# Patient Record
Sex: Male | Born: 1960 | Race: White | Hispanic: No | Marital: Married | Smoking: Former smoker
Health system: Southern US, Community
[De-identification: ages and names within clinical notes are randomized; demographics above are authoritative.]

## PROBLEM LIST (undated history)

## (undated) DIAGNOSIS — F40240 Claustrophobia: Secondary | ICD-10-CM

## (undated) DIAGNOSIS — R0981 Nasal congestion: Secondary | ICD-10-CM

## (undated) DIAGNOSIS — I10 Essential (primary) hypertension: Secondary | ICD-10-CM

## (undated) DIAGNOSIS — G629 Polyneuropathy, unspecified: Secondary | ICD-10-CM

## (undated) DIAGNOSIS — J189 Pneumonia, unspecified organism: Secondary | ICD-10-CM

## (undated) DIAGNOSIS — R519 Headache, unspecified: Secondary | ICD-10-CM

## (undated) DIAGNOSIS — K5900 Constipation, unspecified: Secondary | ICD-10-CM

## (undated) DIAGNOSIS — R51 Headache: Secondary | ICD-10-CM

## (undated) DIAGNOSIS — F419 Anxiety disorder, unspecified: Secondary | ICD-10-CM

## (undated) DIAGNOSIS — J449 Chronic obstructive pulmonary disease, unspecified: Secondary | ICD-10-CM

## (undated) DIAGNOSIS — E119 Type 2 diabetes mellitus without complications: Secondary | ICD-10-CM

## (undated) HISTORY — PX: COLONOSCOPY: SHX174

## (undated) HISTORY — PX: ROTATOR CUFF REPAIR: SHX139

---

## 2010-07-27 ENCOUNTER — Encounter: Payer: Self-pay | Admitting: Surgery

## 2010-08-24 ENCOUNTER — Encounter (INDEPENDENT_AMBULATORY_CARE_PROVIDER_SITE_OTHER): Payer: Federal, State, Local not specified - PPO | Admitting: Surgery

## 2010-08-24 ENCOUNTER — Encounter (INDEPENDENT_AMBULATORY_CARE_PROVIDER_SITE_OTHER): Payer: Federal, State, Local not specified - PPO

## 2010-08-24 DIAGNOSIS — I739 Peripheral vascular disease, unspecified: Secondary | ICD-10-CM

## 2010-08-24 DIAGNOSIS — M79609 Pain in unspecified limb: Secondary | ICD-10-CM

## 2010-08-25 NOTE — Assessment & Plan Note (Signed)
OFFICE VISIT  Bowns, Lajuan L DOB:  [birth year]                                       08/24/2010 CHART#:[id number]  REASON FOR VISIT:  Leg pain.  HISTORY:  The is a 50 year old gentleman I am seeing at the request of Dr. Mayford Knife for evaluation of bilateral leg pain left greater than right.  The patient states that specifically on the left side beginning at the knee and going down to his foot it feels numbness.  This has been going on for several years.  It occasionally causes some burning problems.  It can occur at anytime.  It is usually relieved with walking around.  He states it is also better when he does not wear a sock.  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  He does not endorse cramping or pain with walking.  He denies having any nonhealing wounds or ulcers on his feet.  The patient does suffer from hypertension and hypercholesterolemia, both of which are medically managed.  He is a 2 pack a day smoker.  REVIEW OF SYSTEMS:  Positive for reflux, constipation, pain in his legs. All other review of systems are negative.  PAST MEDICAL HISTORY:  Hypertension, hypercholesterolemia.  PAST SURGICAL HISTORY:  None.  SOCIAL HISTORY:  He is married, 3 children.  Smokes 2 packs a day.  Does not drink.  PHYSICAL EXAM:  Vital signs:  Heart rate 78, blood pressure 134/88, O2 sats 98%.  General:  He is well-appearing, in no distress.  HEENT: Within normal limits.  Lungs:  Respirations are nonlabored. Cardiovascular:  He has palpable dorsalis pedis pulses bilaterally. Skin:  Without rash.  DIAGNOSTIC STUDIES:  ABIs were performed today.  They are 1.1 bilaterally with triphasic waveforms.  ASSESSMENT:  Bilateral leg pain left greater than right.  PLAN:  I discussed today that based on the physical examination findings as well as the vascular lab studies his problems are not related to arterial insufficiency.  I  relayed to him that I believe his discomfort most likely is related to his job as he is on his knees all day every day and it has been that way for many years.  This has probably caused some nerve destruction or bone destruction that is contributing to his problems.  I would recommend seeing an orthopedist.  I am sure he has already done this in the past but a second opinion may be warranted.  In addition, we discussed the importance of smoking cessation.  We spent approximately 5 minutes talking about the ramifications of smoking in him.  I will not plan on seeing him back but will be happy to assist in any way I can.    Jorge Ny, MD Electronically Signed  VWB/MEDQ  D:  08/24/2010  T:  08/25/2010  Job:  [id number]  cc:   Dr Rance Muir

## 2015-11-04 ENCOUNTER — Encounter (HOSPITAL_COMMUNITY): Payer: Self-pay | Admitting: Emergency Medicine

## 2015-11-04 ENCOUNTER — Emergency Department (HOSPITAL_COMMUNITY)
Admission: EM | Admit: 2015-11-04 | Discharge: 2015-11-04 | Disposition: A | Discharge status: Home / Self Care | Payer: BLUE CROSS/BLUE SHIELD | Attending: Emergency Medicine | Admitting: Emergency Medicine

## 2015-11-04 DIAGNOSIS — I10 Essential (primary) hypertension: Secondary | ICD-10-CM | POA: Insufficient documentation

## 2015-11-04 DIAGNOSIS — F17200 Nicotine dependence, unspecified, uncomplicated: Secondary | ICD-10-CM | POA: Diagnosis not present

## 2015-11-04 DIAGNOSIS — R42 Dizziness and giddiness: Secondary | ICD-10-CM | POA: Diagnosis present

## 2015-11-04 HISTORY — DX: Polyneuropathy, unspecified: G62.9

## 2015-11-04 HISTORY — DX: Essential (primary) hypertension: I10

## 2015-11-04 LAB — CBC
HEMATOCRIT: 52.6 % — AB (ref 39.0–52.0)
HEMOGLOBIN: 18.4 g/dL — AB (ref 13.0–17.0)
MCH: 30.6 pg (ref 26.0–34.0)
MCHC: 35 g/dL (ref 30.0–36.0)
MCV: 87.5 fL (ref 78.0–100.0)
Platelets: 185 10*3/uL (ref 150–400)
RBC: 6.01 MIL/uL — ABNORMAL HIGH (ref 4.22–5.81)
RDW: 13.5 % (ref 11.5–15.5)
WBC: 10.2 10*3/uL (ref 4.0–10.5)

## 2015-11-04 LAB — BASIC METABOLIC PANEL
Anion gap: 7 (ref 5–15)
BUN: 14 mg/dL (ref 6–20)
CALCIUM: 9.1 mg/dL (ref 8.9–10.3)
CO2: 24 mmol/L (ref 22–32)
Chloride: 107 mmol/L (ref 101–111)
Creatinine, Ser: 0.98 mg/dL (ref 0.61–1.24)
GLUCOSE: 115 mg/dL — AB (ref 65–99)
Potassium: 4.1 mmol/L (ref 3.5–5.1)
Sodium: 138 mmol/L (ref 135–145)

## 2015-11-04 LAB — I-STAT TROPONIN, ED: Troponin i, poc: 0 ng/mL (ref 0.00–0.08)

## 2015-11-04 MED ORDER — MECLIZINE HCL 25 MG PO TABS
12.5000 mg | ORAL_TABLET | Freq: Once | ORAL | Status: AC
Start: 2015-11-04 — End: 2015-11-04
  Administered 2015-11-04: 12.5 mg via ORAL
  Filled 2015-11-04: qty 1

## 2015-11-04 MED ORDER — MECLIZINE HCL 12.5 MG PO TABS: 12.5000 mg | ORAL_TABLET | Freq: Three times a day (TID) | ORAL | Status: DC | PRN

## 2015-11-04 NOTE — Discharge Instructions (Signed)
Please read and follow all provided instructions.  Your diagnoses today include:  1. Essential hypertension    Tests performed today include:  Vital signs. See below for your results today.   Medications prescribed:   Take as prescribed   Home care instructions:  Follow any educational materials contained in this packet.  Follow-up instructions: Please follow-up with a primary care provider for further evaluation of symptoms and treatment. Call the number provided on discharge paperwork to establish one.    Return instructions:   Please return to the Emergency Department if you do not get better, if you get worse, or new symptoms OR  - Fever (temperature greater than 101.16F)  - Bleeding that does not stop with holding pressure to the area    -Severe pain (please note that you may be more sore the day after your accident)  - Chest Pain  - Difficulty breathing  - Severe nausea or vomiting  - Inability to tolerate food and liquids  - Passing out  - Skin becoming red around your wounds  - Change in mental status (confusion or lethargy)  - New numbness or weakness     Please return if you have any other emergent concerns.  Additional Information:  Your vital signs today were: BP 162/103 mmHg   Pulse 86   Temp(Src) 97.7 F (36.5 C) (Oral)   Resp 17   Ht 5\' 3"  (1.6 m)   Wt 91.173 kg   BMI 35.61 kg/m2   SpO2 98% If your blood pressure (BP) was elevated above 135/85 this visit, please have this repeated by your doctor within one month. ---------------

## 2015-11-04 NOTE — ED Notes (Signed)
Pt. Stated, I've been dizzy for about a year and i take medication and I've been going to [place] in [place].

## 2015-11-04 NOTE — ED Provider Notes (Signed)
CSN: [id number]     Arrival date & time 11/04/15  0840 History   First MD Initiated Contact with Patient 11/04/15 [phone]     Chief Complaint  Patient presents with  . Dizziness   (Consider location/radiation/quality/duration/timing/severity/associated sxs/prior Treatment) HPI 55 y.o. male with a hx of HTN, presents to the Emergency Department today complaining of dizziness x 1 year. Pt notes that there are no exacerbating factors with position or activities. States that this morning he woke up and did his normal morning activities. He went outside to smoke and became nauseas and felt his heart racing. States that he felt the room spinning. No pain at that time. Hx of the same over the past year. Seen by PCP in [place] and given Meclizine, which has helped, but patient ran out of medication. No CP/SOB/ABD pain. No emesis or diarrhea. No headaches. No vision changes. No fevers. Pt has borderline diabetes which he controls with diet. Has HTN which he takes a medication, but is unsure which one. No other symptoms noted.   Past Medical History  Diagnosis Date  . Hypertension   . Neuropathy (HCC)    History reviewed. No pertinent past surgical history. No family history on file. Social History  Substance Use Topics  . Smoking status: Current Every Day Smoker  . Smokeless tobacco: None  . Alcohol Use: No    Review of Systems ROS reviewed and all are negative for acute change except as noted in the HPI.  Allergies  Review of patient's allergies indicates not on file.  Home Medications   Prior to Admission medications   Not on File   BP 162/103 mmHg  Pulse 86  Temp(Src) 97.7 F (36.5 C) (Oral)  Resp 17  Ht 5\' 3"  (1.6 m)  Wt 91.173 kg  BMI 35.61 kg/m2  SpO2 98%   Physical Exam  Constitutional: He is oriented to person, place, and time. He appears well-developed and well-nourished.  HENT:  Head: Normocephalic and atraumatic.  Eyes: EOM are normal. Pupils are equal, round, and  reactive to light.  Neck: Normal range of motion. Neck supple. No tracheal deviation present.  Cardiovascular: Normal rate, regular rhythm, normal heart sounds and intact distal pulses.   No murmur heard. Pulmonary/Chest: Effort normal and breath sounds normal. No respiratory distress. He has no wheezes. He has no rales. He exhibits no tenderness.  Abdominal: Soft. There is no tenderness.  Musculoskeletal: Normal range of motion.  Neurological: He is alert and oriented to person, place, and time. He has normal strength. No cranial nerve deficit or sensory deficit.  Cranial Nerves:  II: Pupils equal, round, reactive to light III,IV, VI: ptosis not present, extra-ocular motions intact bilaterally  V,VII: smile symmetric, facial light touch sensation equal VIII: hearing grossly normal bilaterally  IX,X: midline uvula rise  XI: bilateral shoulder shrug equal and strong XII: midline tongue extension  Skin: Skin is warm and dry.  Psychiatric: He has a normal mood and affect. His behavior is normal. Thought content normal.  Nursing note and vitals reviewed.  ED Course  Procedures (including critical care time) Labs Review Labs Reviewed  CBC - Abnormal; Notable for the following:    RBC 6.01 (*)    Hemoglobin 18.4 (*)    HCT 52.6 (*)    All other components within normal limits  BASIC METABOLIC PANEL  I-STAT TROPOININ, ED   Imaging Review No results found. I have personally reviewed and evaluated these images and lab results as part of my  medical decision-making.   EKG Interpretation   Date/Time:  Tuesday November 04 2015 09:20:14 EDT Ventricular Rate:  79 PR Interval:    QRS Duration: 101 QT Interval:  375 QTC Calculation: 430 R Axis:   59 Text Interpretation:  Sinus rhythm Baseline wander in lead(s) V1 No old  tracing to compare Confirmed by FLOYD MD, DANIEL [id number]) on 11/04/2015  9:37:35 AM      MDM  I have reviewed and evaluated the relevant laboratory valuesI have  interpreted the relevant EKG.I have reviewed the relevant previous healthcare records.I obtained HPI from historian. Patient discussed with supervising physician  ED Course:  Assessment: Pt is a 55yM with hx HTN who presents with dizziness x 1 year. Onset this AM with nausea, dizziness and palpitations. Hx of the same x 1 year. No exacerbating factors. Happens multiple occasions throughout the day. Used to be on Meclizine, which helped, but pt does not have medication now. On exam, pt in NAD. Nontoxic/nonseptic appearing. VSS. Afebrile. Lungs CTA. Heart RRR. Abdomen nontender soft. CN Evaluated and unremarkable. iStat Trop negative. CBC showed elevated HGB/RBC/HCT likely due to chronic smoking. BMP unremarkable. EKG NSR. Possible HTN related due to elevated BP this AM, even with medication this AM. Question underlying anxiety? Pt planning on finding [place]CP here in [place]. Will refill Rx meclizine due to hx of improvement while on medication. No acute underlying pathology identified on labs/exam. Will not add or change BP medication as pt is unsure what he is on. Counseled on diet control and exercise. Will leave for PCP to change. Plan is to DC home with follow up to PCP. At time of discharge, Patient is in no acute distress. Vital Signs are stable. Patient is able to ambulate. Patient able to tolerate PO.    Disposition/Plan:  DC Home Additional Verbal discharge instructions given and discussed with patient.  Pt Instructed to f/u with PCP in the next week for evaluation and treatment of symptoms. Return precautions given Pt acknowledges and agrees with plan  Supervising Physician Melene Planan Floyd, DO   Final diagnoses:  Essential hypertension        Audry Piliyler Jennie Bolar, PA-C 11/04/15 1016  Melene Planan Floyd, DO 11/04/15 1021

## 2016-10-18 ENCOUNTER — Encounter (HOSPITAL_COMMUNITY): Payer: Self-pay

## 2016-10-18 ENCOUNTER — Emergency Department (HOSPITAL_COMMUNITY)
Admission: EM | Admit: 2016-10-18 | Discharge: 2016-10-19 | Disposition: A | Discharge status: Home / Self Care | Payer: BLUE CROSS/BLUE SHIELD | Attending: Emergency Medicine | Admitting: Emergency Medicine

## 2016-10-18 DIAGNOSIS — R2243 Localized swelling, mass and lump, lower limb, bilateral: Secondary | ICD-10-CM | POA: Diagnosis present

## 2016-10-18 DIAGNOSIS — I872 Venous insufficiency (chronic) (peripheral): Secondary | ICD-10-CM

## 2016-10-18 DIAGNOSIS — E785 Hyperlipidemia, unspecified: Secondary | ICD-10-CM | POA: Diagnosis not present

## 2016-10-18 DIAGNOSIS — E1140 Type 2 diabetes mellitus with diabetic neuropathy, unspecified: Secondary | ICD-10-CM | POA: Insufficient documentation

## 2016-10-18 DIAGNOSIS — Z87891 Personal history of nicotine dependence: Secondary | ICD-10-CM | POA: Diagnosis not present

## 2016-10-18 DIAGNOSIS — I10 Essential (primary) hypertension: Secondary | ICD-10-CM | POA: Insufficient documentation

## 2016-10-18 DIAGNOSIS — I739 Peripheral vascular disease, unspecified: Secondary | ICD-10-CM

## 2016-10-18 HISTORY — DX: Type 2 diabetes mellitus without complications: E11.9

## 2016-10-18 LAB — COMPREHENSIVE METABOLIC PANEL
ALBUMIN: 4 g/dL (ref 3.5–5.0)
ALK PHOS: 78 U/L (ref 38–126)
ALT: 24 U/L (ref 17–63)
ANION GAP: 10 (ref 5–15)
AST: 25 U/L (ref 15–41)
BILIRUBIN TOTAL: 0.7 mg/dL (ref 0.3–1.2)
BUN: 24 mg/dL — AB (ref 6–20)
CALCIUM: 8.9 mg/dL (ref 8.9–10.3)
CO2: 21 mmol/L — ABNORMAL LOW (ref 22–32)
Chloride: 106 mmol/L (ref 101–111)
Creatinine, Ser: 1.37 mg/dL — ABNORMAL HIGH (ref 0.61–1.24)
GFR calc Af Amer: >60 mL/min (ref >60)
GFR calc non Af Amer: 57 mL/min — ABNORMAL LOW (ref >60)
GLUCOSE: 116 mg/dL — AB (ref 65–99)
Potassium: 3.7 mmol/L (ref 3.5–5.1)
Sodium: 137 mmol/L (ref 135–145)
TOTAL PROTEIN: 6.7 g/dL (ref 6.5–8.1)

## 2016-10-18 LAB — CBC WITH DIFFERENTIAL/PLATELET
BASOS ABS: 0 10*3/uL (ref 0.0–0.1)
BASOS PCT: 0 %
EOS PCT: 2 %
Eosinophils Absolute: 0.1 10*3/uL (ref 0.0–0.7)
HCT: 46.4 % (ref 39.0–52.0)
Hemoglobin: 16.2 g/dL (ref 13.0–17.0)
Lymphocytes Relative: 32 %
Lymphs Abs: 2.8 10*3/uL (ref 0.7–4.0)
MCH: 30.2 pg (ref 26.0–34.0)
MCHC: 34.9 g/dL (ref 30.0–36.0)
MCV: 86.6 fL (ref 78.0–100.0)
MONO ABS: 0.6 10*3/uL (ref 0.1–1.0)
Monocytes Relative: 6 %
Neutro Abs: 5.2 10*3/uL (ref 1.7–7.7)
Neutrophils Relative %: 60 %
PLATELETS: 193 10*3/uL (ref 150–400)
RBC: 5.36 MIL/uL (ref 4.22–5.81)
RDW: 13.3 % (ref 11.5–15.5)
WBC: 8.8 10*3/uL (ref 4.0–10.5)

## 2016-10-18 NOTE — ED Provider Notes (Signed)
[facility]-EMERGENCY DEPT Provider Note   CSN: [id number] Arrival date & time: 10/18/16  2056  By signing my name below, I, Brett Fox, attest that this documentation has been prepared under the direction and in the presence of Brett Fox, Brett Alaimo, MD. Electronically Signed: Karren CobbleNy'Kea Fox, ED Scribe. 10/19/16. 12:01 AM.  History   Chief Complaint Chief Complaint  Patient presents with  . Leg Swelling   The history is provided by the patient and the spouse. No language interpreter was used.   HPI Comments: Brett Fox is a 56 y.o. male with a PMHx of HTN, HLD, neuropathy, and DM II who presents to the Emergency Department with  consistent bilateral lower extremity edema that began three weeks ago. Pt notes associated redness to right lower extremity, tingling to hi right heel, and a burning sensation. Three weeks ago, he notes BLE edema that worsens with standing and resolves while and resting at night. He reports he ambulatory without issues but notes associated discomfort. Pt states he had a sonogram fours days ago for possible DVT and he reports today they called him for a follow-up sonogram. He using ice packs with improvement of swelling. He reports a hx of tobacco use at two packs a day, since he was a teen but he states he quit one month ago. He denies experiences shortness of breath or chest pain with exertion, or hx of sleep apnea.  Denies chest pain, numbness, shortness of breath, weakness, back pain, or abdominal pain.   Past Medical History:  Diagnosis Date  . Diabetes mellitus without complication (HCC)   . Hypertension   . Neuropathy    There are no active problems to display for this patient.  History reviewed. No pertinent surgical history.  Home Medications    Prior to Admission medications   Medication Sig Start Date End Date Taking? Authorizing Provider  meclizine (ANTIVERT) 12.5 MG tablet Take 1 tablet (12.5 mg total) by mouth 3 (three) times daily as needed for  dizziness. 11/04/15   Audry PiliMohr, Tyler, PA-C   Family History History reviewed. No pertinent family history.  Social History Social History  Substance Use Topics  . Smoking status: Former Smoker    Quit date: 09/17/2016  . Smokeless tobacco: Not on file  . Alcohol use No   Allergies   Patient has no known allergies.  Review of Systems Review of Systems  Respiratory: Negative for shortness of breath.   Cardiovascular: Positive for leg swelling. Negative for chest pain.  Gastrointestinal: Negative for abdominal pain.  Musculoskeletal: Negative for back pain.  Skin:       Redness on the right lower extremity.  Neurological: Negative for weakness and numbness.  All other systems reviewed and are negative.  Physical Exam Updated Vital Signs BP (!) 145/92 (BP Location: Right Arm)   Pulse 70   Temp 98 F (36.7 C) (Oral)   Resp 18   Ht 5\' 3"  (1.6 m)   Wt 104.3 kg (230 lb)   SpO2 98%   BMI 40.74 kg/m   Physical Exam  Constitutional: He is oriented to person, place, and time. He appears well-developed and well-nourished.  HENT:  Head: Normocephalic.  Eyes: EOM are normal.  Neck: Normal range of motion.  Cardiovascular: Normal rate, regular rhythm and intact distal pulses.   2+ equal radial pulses bilaterally. Bilateral dp pulses that are palpable.   Pulmonary/Chest: Effort normal.  Lungs are clear to auscultation.   Abdominal: Soft. He exhibits no distension.  Abdomen is soft  with no palpable mass.  Musculoskeletal: Normal range of motion. He exhibits edema. He exhibits no tenderness.  Trace pitting edema bilaterally, 1+ over the feet. No calf tenderness noted BLE.    Neurological: He is alert and oriented to person, place, and time.  Psychiatric: He has a normal mood and affect.  Nursing note and vitals reviewed.  ED Treatments / Results  DIAGNOSTIC STUDIES: Oxygen Saturation is 96% on RA, adqueate by my interpretation.   COORDINATION OF CARE: 11:15 PM-Discussed next  steps with pt. Pt verbalized understanding and is agreeable with the plan.   Labs (all labs ordered are listed, but only abnormal results are displayed) Labs Reviewed  COMPREHENSIVE METABOLIC PANEL - Abnormal; Notable for the following:       Result Value   CO2 21 (*)    Glucose, Bld 116 (*)    BUN 24 (*)    Creatinine, Ser 1.37 (*)    GFR calc non Af Amer 57 (*)    All other components within normal limits  CBC WITH DIFFERENTIAL/PLATELET    EKG  EKG Interpretation None       Radiology No results found.  Procedures Procedures (including critical care time)  Medications Ordered in ED Medications - No data to display   Initial Impression / Assessment and Plan / ED Course  I have reviewed the triage vital signs and the nursing notes.  Pertinent labs & imaging results that were available during my care of the patient were reviewed by me and considered in my medical decision making (see chart for details).     Pt comes in with cc of leg swelling and pain. Pt has extensive smoking hx and has DM and neuropathy hx. Pt has no claudication. He has no chest pain, abd pain, back pain. He has no leg weakness. Pain is anterior, and is neuropathic type. The swelling in the leg appears to be due to venous insuffiencey. Pt's PCP had ordered a DVT scan that was neg - and pretest probability for that was already really low. There is no signs of cellulitis either. Pt reports that pcp has planned US aorta. Currently, there is no evidence of clinically significant clot burden in the arterial system that would cause arterial insufficiency, and thus CT or vascular consult not indicated. Close PCP f.u is reqd, and advised. Strict ER return precautions have been discussed, and patient is agreeing with the plan and is comfortable with the workup done and the recommendations from the ER.  Smoking cessation instruction/counseling given:  counseled patient on the dangers of tobacco use, advised  patient to stop smoking, and reviewed strategies to maximize success    Final Clinical Impressions(s) / ED Diagnoses   Final diagnoses:  Peripheral vascular disease (HCC)  Venous insufficiency of both lower extremities    New Prescriptions Discharge Medication List as of 10/19/2016 12:05 AM    I personally performed the services described in this documentation, which was scribed in my presence. The recorded information has been reviewed and is accurate.     Brett Kaplan, MD 10/20/16 [phone]

## 2016-10-18 NOTE — ED Notes (Signed)
ED Provider at bedside. 

## 2016-10-18 NOTE — ED Triage Notes (Signed)
Pt endorses bilateral leg swelling x 3 weeks. Pt has been followed by pcp and started on a fluid pill x 2 weeks ago. Pt had ultrasound completed on both legs today and was told "everything looked good" VSS. Pt ambulatory

## 2016-10-19 NOTE — Discharge Instructions (Signed)
We suspect that the leg swelling is due to venous insufficiency - and the treatment will be wearing compression stockings every day. At night time you want to remove the stockings but keep the leg elevated. See your doctor for continued diagnostic workup.  Additionally, the leg pain appears to be neuropathic type pain. Again - please ask your primary doctor if they also think the pain is neuropathy, and if yes, put you on medications that might help.  Continue to refrain from smoking.  Return to the ER if there is severe pain to the legs, there is dusky appearing foot or loss of sensation in the legs.

## 2016-10-19 NOTE — ED Notes (Signed)
Pt unable to sign upon discharge due to signature pad not working. Pt verbalized understanding of discharge instructions and follow up care. Nad pt a.o and ambulatory upon discharge

## 2018-05-31 ENCOUNTER — Other Ambulatory Visit: Payer: Self-pay | Admitting: Specialist

## 2018-05-31 DIAGNOSIS — S0540XA Penetrating wound of orbit with or without foreign body, unspecified eye, initial encounter: Secondary | ICD-10-CM

## 2018-05-31 DIAGNOSIS — G4459 Other complicated headache syndrome: Secondary | ICD-10-CM

## 2018-06-13 ENCOUNTER — Ambulatory Visit
Admission: RE | Admit: 2018-06-13 | Discharge: 2018-06-13 | Disposition: A | Discharge status: Home / Self Care | Payer: Federal, State, Local not specified - PPO | Source: Ambulatory Visit | Attending: Specialist | Admitting: Specialist

## 2018-06-13 DIAGNOSIS — G4459 Other complicated headache syndrome: Secondary | ICD-10-CM

## 2018-06-13 DIAGNOSIS — S0540XA Penetrating wound of orbit with or without foreign body, unspecified eye, initial encounter: Secondary | ICD-10-CM

## 2018-06-15 ENCOUNTER — Other Ambulatory Visit (HOSPITAL_COMMUNITY): Payer: Self-pay | Admitting: Specialist

## 2018-06-15 DIAGNOSIS — G4459 Other complicated headache syndrome: Secondary | ICD-10-CM

## 2018-07-10 ENCOUNTER — Encounter (HOSPITAL_COMMUNITY): Payer: Self-pay | Admitting: *Deleted

## 2018-07-10 ENCOUNTER — Other Ambulatory Visit: Payer: Self-pay

## 2018-07-10 NOTE — Progress Notes (Addendum)
Mr Brett Fox denies chest pain or shortness of breath. Mr Brett Fox has history of HTN, and sees Dr.  Hyacinth Meeker a cardiologist in [place].  Patient states "that Heart is fine".  I requested records.  Mr. Brett Fox reports that he checks CBG every evening, after dinner, it runs in the 130's.  PCP is Truddie Coco, NP with [facility] in [place], I requested records.  Mr Brett Fox repots that years ago PCP started him on oxygen- 2 L at bedtime.  Patient denies being tested for sleep apnea.  Office visit 1Za I instructed Mr Brett Fox to hold medications for diabetes the morning of surgery. I instructed patient to check CBG after awaking and every 2 hours until arrival  to the hospital.  I Instructed patient if CBG is less than 70 to drink 1/2 cup of a clear juice. Recheck CBG in 15 minutes then call pre- op desk at [phone] for further instructions.   I received records from PCP, patien thad sleep study in 2015, MD wants to repeat it

## 2018-07-11 ENCOUNTER — Ambulatory Visit (HOSPITAL_COMMUNITY)
Admission: RE | Admit: 2018-07-11 | Discharge: 2018-07-11 | Disposition: A | Discharge status: Home / Self Care | Payer: BLUE CROSS/BLUE SHIELD | Source: Ambulatory Visit | Attending: Specialist | Admitting: Specialist

## 2018-07-11 ENCOUNTER — Encounter (HOSPITAL_COMMUNITY): Payer: Self-pay

## 2018-07-11 ENCOUNTER — Ambulatory Visit (HOSPITAL_COMMUNITY)
Admission: RE | Admit: 2018-07-11 | Discharge: 2018-07-11 | Disposition: A | Discharge status: Home / Self Care | Payer: BLUE CROSS/BLUE SHIELD | Attending: Specialist | Admitting: Specialist

## 2018-07-11 ENCOUNTER — Ambulatory Visit (HOSPITAL_COMMUNITY): Payer: BLUE CROSS/BLUE SHIELD | Admitting: Certified Registered"

## 2018-07-11 ENCOUNTER — Encounter (HOSPITAL_COMMUNITY)
Admission: RE | Disposition: A | Discharge status: Home / Self Care | Payer: Self-pay | Source: Home / Self Care | Attending: Specialist

## 2018-07-11 DIAGNOSIS — Z87891 Personal history of nicotine dependence: Secondary | ICD-10-CM | POA: Diagnosis not present

## 2018-07-11 DIAGNOSIS — G4459 Other complicated headache syndrome: Secondary | ICD-10-CM | POA: Diagnosis present

## 2018-07-11 DIAGNOSIS — J449 Chronic obstructive pulmonary disease, unspecified: Secondary | ICD-10-CM | POA: Insufficient documentation

## 2018-07-11 DIAGNOSIS — I10 Essential (primary) hypertension: Secondary | ICD-10-CM | POA: Insufficient documentation

## 2018-07-11 DIAGNOSIS — Z9981 Dependence on supplemental oxygen: Secondary | ICD-10-CM | POA: Diagnosis not present

## 2018-07-11 DIAGNOSIS — E119 Type 2 diabetes mellitus without complications: Secondary | ICD-10-CM | POA: Diagnosis not present

## 2018-07-11 DIAGNOSIS — Z79899 Other long term (current) drug therapy: Secondary | ICD-10-CM | POA: Diagnosis not present

## 2018-07-11 HISTORY — PX: RADIOLOGY WITH ANESTHESIA: SHX6223

## 2018-07-11 HISTORY — DX: Chronic obstructive pulmonary disease, unspecified: J44.9

## 2018-07-11 HISTORY — DX: Headache, unspecified: R51.9

## 2018-07-11 HISTORY — DX: Constipation, unspecified: K59.00

## 2018-07-11 HISTORY — DX: Anxiety disorder, unspecified: F41.9

## 2018-07-11 HISTORY — DX: Nasal congestion: R09.81

## 2018-07-11 HISTORY — DX: Headache: R51

## 2018-07-11 HISTORY — DX: Claustrophobia: F40.240

## 2018-07-11 HISTORY — DX: Pneumonia, unspecified organism: J18.9

## 2018-07-11 LAB — BASIC METABOLIC PANEL
ANION GAP: 11 (ref 5–15)
BUN: 17 mg/dL (ref 6–20)
CO2: 21 mmol/L — ABNORMAL LOW (ref 22–32)
Calcium: 8.7 mg/dL — ABNORMAL LOW (ref 8.9–10.3)
Chloride: 105 mmol/L (ref 98–111)
Creatinine, Ser: 1.13 mg/dL (ref 0.61–1.24)
GFR calc non Af Amer: >60 mL/min (ref >60)
Glucose, Bld: 137 mg/dL — ABNORMAL HIGH (ref 70–99)
POTASSIUM: 3.7 mmol/L (ref 3.5–5.1)
Sodium: 137 mmol/L (ref 135–145)

## 2018-07-11 LAB — GLUCOSE, CAPILLARY
Glucose-Capillary: 133 mg/dL — ABNORMAL HIGH (ref 70–99)
Glucose-Capillary: 135 mg/dL — ABNORMAL HIGH (ref 70–99)
Glucose-Capillary: 130 mg/dL — ABNORMAL HIGH (ref 70–99)

## 2018-07-11 LAB — CBC
HCT: 44.6 % (ref 39.0–52.0)
HEMOGLOBIN: 14.8 g/dL (ref 13.0–17.0)
MCH: 28.7 pg (ref 26.0–34.0)
MCHC: 33.2 g/dL (ref 30.0–36.0)
MCV: 86.6 fL (ref 80.0–100.0)
Platelets: 216 10*3/uL (ref 150–400)
RBC: 5.15 MIL/uL (ref 4.22–5.81)
RDW: 13.3 % (ref 11.5–15.5)
WBC: 13.8 10*3/uL — ABNORMAL HIGH (ref 4.0–10.5)
nRBC: 0 % (ref 0.0–0.2)

## 2018-07-11 SURGERY — MRI WITH ANESTHESIA
Anesthesia: General

## 2018-07-11 MED ORDER — LACTATED RINGERS IV SOLN
INTRAVENOUS | Status: DC
  Administered 2018-07-11: 08:00:00 via INTRAVENOUS

## 2018-07-11 MED ORDER — FENTANYL CITRATE (PF) 100 MCG/2ML IJ SOLN
INTRAMUSCULAR | Status: DC | PRN
  Administered 2018-07-11: 50 ug via INTRAVENOUS

## 2018-07-11 MED ORDER — LACTATED RINGERS IV SOLN
INTRAVENOUS | Status: DC | PRN
  Administered 2018-07-11: 09:00:00 via INTRAVENOUS

## 2018-07-11 MED ORDER — LIDOCAINE 2% (20 MG/ML) 5 ML SYRINGE
INTRAMUSCULAR | Status: DC | PRN
  Administered 2018-07-11: 50 mg via INTRAVENOUS

## 2018-07-11 MED ORDER — PROPOFOL 10 MG/ML IV BOLUS
INTRAVENOUS | Status: DC | PRN
  Administered 2018-07-11: 250 mg via INTRAVENOUS

## 2018-07-11 MED ORDER — DEXAMETHASONE SODIUM PHOSPHATE 4 MG/ML IJ SOLN
INTRAMUSCULAR | Status: DC | PRN
  Administered 2018-07-11: 4 mg via INTRAVENOUS

## 2018-07-11 MED ORDER — MIDAZOLAM HCL 5 MG/5ML IJ SOLN
INTRAMUSCULAR | Status: DC | PRN
  Administered 2018-07-11: 2 mg via INTRAVENOUS

## 2018-07-11 MED ORDER — ONDANSETRON HCL 4 MG/2ML IJ SOLN
INTRAMUSCULAR | Status: DC | PRN
  Administered 2018-07-11: 4 mg via INTRAVENOUS

## 2018-07-11 MED ORDER — GLYCOPYRROLATE PF 0.2 MG/ML IJ SOSY
PREFILLED_SYRINGE | INTRAMUSCULAR | Status: DC | PRN
  Administered 2018-07-11: .1 mg via INTRAVENOUS

## 2018-07-11 MED ORDER — PHENYLEPHRINE 40 MCG/ML (10ML) SYRINGE FOR IV PUSH (FOR BLOOD PRESSURE SUPPORT)
PREFILLED_SYRINGE | INTRAVENOUS | Status: DC | PRN
  Administered 2018-07-11 (×2): 80 ug via INTRAVENOUS

## 2018-07-11 NOTE — Anesthesia Postprocedure Evaluation (Signed)
Anesthesia Post Note  Patient: Brett Fox  Procedure(s) Performed: MRI WITH ANESTHESIA OF BRAIN WITHOUT CONTRAST (N/A )     Patient location during evaluation: PACU Anesthesia Type: General Level of consciousness: awake and alert Pain management: pain level controlled Vital Signs Assessment: post-procedure vital signs reviewed and stable Respiratory status: spontaneous breathing, nonlabored ventilation, respiratory function stable and patient connected to nasal cannula oxygen Cardiovascular status: blood pressure returned to baseline and stable Postop Assessment: no apparent nausea or vomiting Anesthetic complications: no    Last Vitals:  Vitals:   07/11/18 1010 07/11/18 1015  BP:  140/78  Pulse: 86 87  Resp: (!) 21 12  Temp:  (!) 36.4 C  SpO2: 93% 92%    Last Pain:  Vitals:   07/11/18 1015  PainSc: 0-No pain                 Kennieth Rad

## 2018-07-11 NOTE — Transfer of Care (Addendum)
Immediate Anesthesia Transfer of Care Note  Patient: Brett Fox  Procedure(s) Performed: MRI WITH ANESTHESIA OF BRAIN WITHOUT CONTRAST (N/A )  Patient Location: PACU  Anesthesia Type:General  Level of Consciousness: awake and patient cooperative  Airway & Oxygen Therapy: Patient Spontanous Breathing and Patient connected to face mask oxygen  Post-op Assessment: Report given to RN, Post -op Vital signs reviewed and stable and Patient moving all extremities X 4  Post vital signs: Reviewed and stable  Last Vitals:  Vitals Value Taken Time  BP 121/76 07/11/2018 10:00 AM  Temp 36.4 C 07/11/2018 10:00 AM  Pulse 89 07/11/2018 10:12 AM  Resp 17 07/11/2018 10:12 AM  SpO2 92 % 07/11/2018 10:12 AM  Vitals shown include unvalidated device data.  Last Pain:  Vitals:   07/11/18 1000  PainSc: 0-No pain         Complications: No apparent anesthesia complications

## 2018-07-11 NOTE — Anesthesia Preprocedure Evaluation (Signed)
Anesthesia Evaluation  Patient identified by MRN, date of birth, ID band Patient awake    Reviewed: Allergy & Precautions, NPO status , Patient's Chart, lab work & pertinent test results, reviewed documented beta blocker date and time   Airway Mallampati: II  TM Distance: >3 FB Neck ROM: Full    Dental  (+) Dental Advisory Given   Pulmonary COPD,  oxygen dependent, former smoker,    breath sounds clear to auscultation       Cardiovascular hypertension, Pt. on medications and Pt. on home beta blockers  Rhythm:Regular Rate:Normal     Neuro/Psych  Headaches,    GI/Hepatic negative GI ROS, Neg liver ROS,   Endo/Other  diabetes, Type 2  Renal/GU negative Renal ROS     Musculoskeletal   Abdominal   Peds  Hematology negative hematology ROS (+)   Anesthesia Other Findings   Reproductive/Obstetrics                             Anesthesia Physical Anesthesia Plan  ASA: III  Anesthesia Plan: General   Post-op Pain Management:    Induction: Intravenous  PONV Risk Score and Plan: 2 and Dexamethasone, Ondansetron and Treatment may vary due to age or medical condition  Airway Management Planned: LMA  Additional Equipment:   Intra-op Plan:   Post-operative Plan: Extubation in OR  Informed Consent: I have reviewed the patients History and Physical, chart, labs and discussed the procedure including the risks, benefits and alternatives for the proposed anesthesia with the patient or authorized representative who has indicated his/her understanding and acceptance.     Dental advisory given  Plan Discussed with: CRNA  Anesthesia Plan Comments:         Anesthesia Quick Evaluation

## 2018-07-12 ENCOUNTER — Encounter (HOSPITAL_COMMUNITY): Payer: Self-pay | Admitting: Radiology

## 2019-04-04 IMAGING — MR MRI HEAD WITHOUT CONTRAST
11 of 12 series · 44 of 48 positions shown · non-contrast
Comparison: CT head 05/24/2018

CLINICAL DATA: Patient complains of lightning bolt feeling in his
head.

EXAM:
MRI HEAD WITHOUT CONTRAST
TECHNIQUE: Multiplanar, multiecho pulse sequences of the brain and surrounding
structures were obtained without intravenous contrast.

[Series 5: DWI · axial · 3.0mm · 0.88mm/px · z∈[+4,+150]mm · 9 of 104 slices shown (1 of 4)]
[im 1/104]
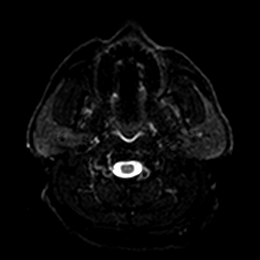
[im 13/104]
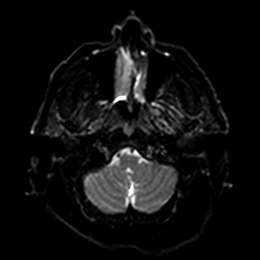
[im 26/104]
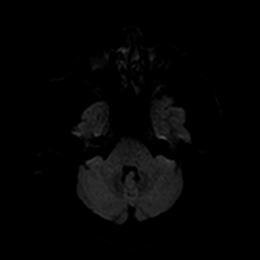
[im 39/104]
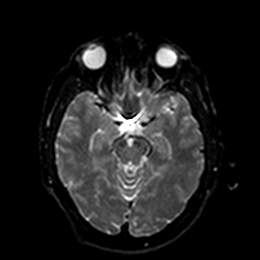
[im 52/104]
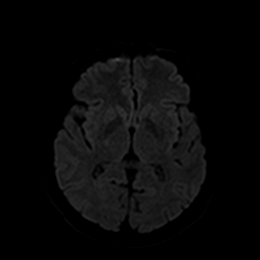
[im 65/104]
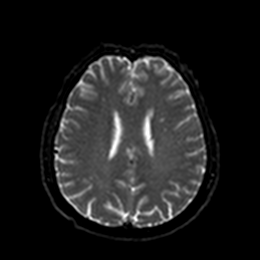
[im 78/104]
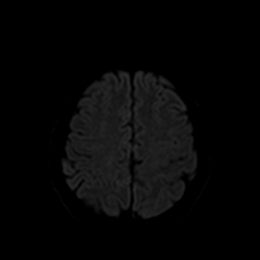
[im 91/104]
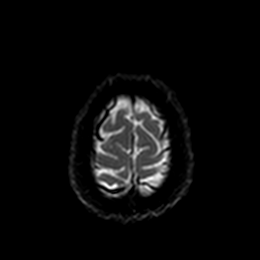
[im 104/104]
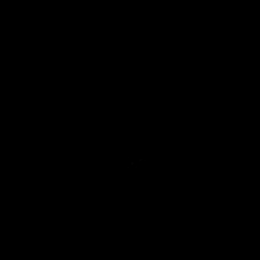

[Series 6: DWI · axial · 3.0mm · 0.88mm/px · z∈[+4,+150]mm · 4 of 51 slices shown (2 of 4)]
[im 1/51]
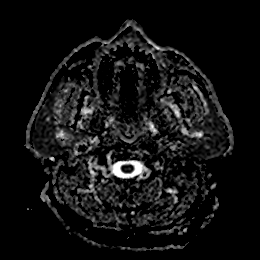
[im 17/51]
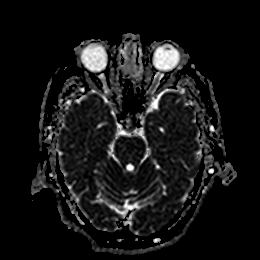
[im 34/51]
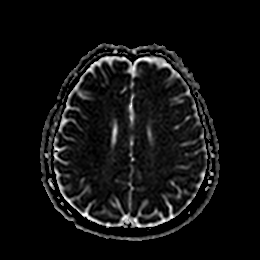
[im 51/51]
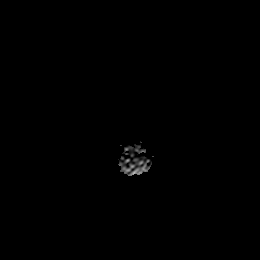

[Series 7: DWI · coronal · 4.0mm · 0.88mm/px · 5 of 68 slices shown (3 of 4)]
[im 1/68]
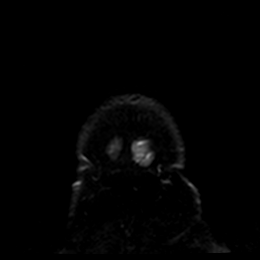
[im 17/68]
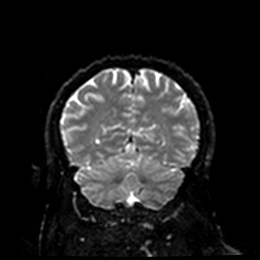
[im 34/68]
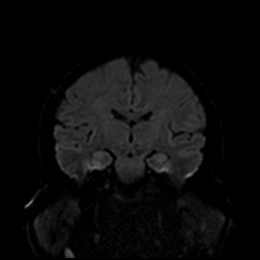
[im 51/68]
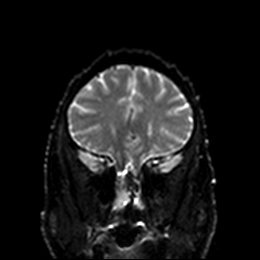
[im 68/68]
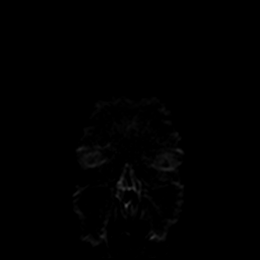

[Series 8: DWI · coronal · 4.0mm · 0.88mm/px · 2 of 34 slices shown (4 of 4)]
[im 1/34]
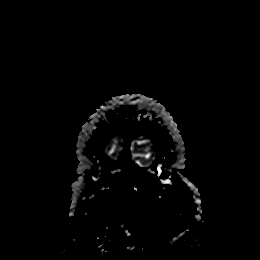
[im 34/34]
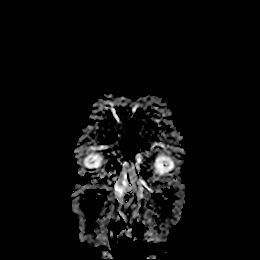

[Series 9: T1 · sagittal · 5.0mm · 0.75mm/px · 2 of 24 slices shown]
[im 1/24]
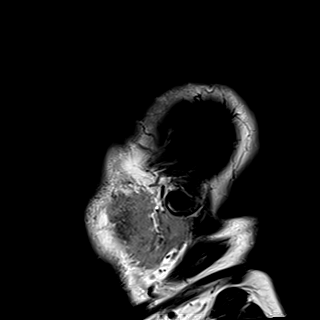
[im 24/24]
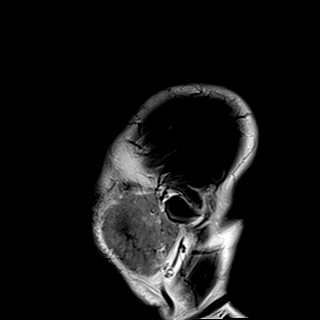

[Series 10: T2 · axial · 3.0mm · 0.72mm/px · z∈[+4,+150]mm · 4 of 52 slices shown (1 of 2)]
[im 1/52]
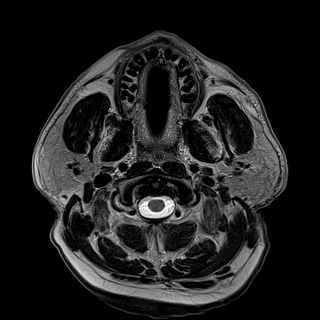
[im 18/52]
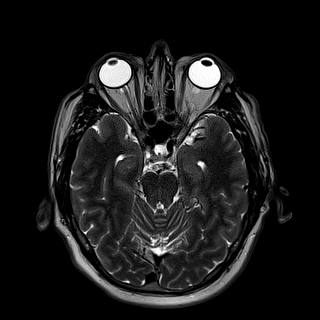
[im 35/52]
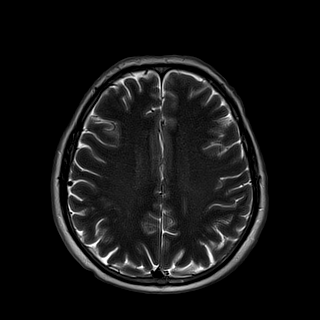
[im 52/52]
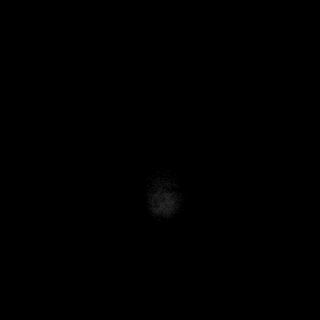

[Series 11: FLAIR · axial · 3.0mm · 0.45mm/px · z∈[+2,+148]mm · 4 of 52 slices shown]
[im 1/52]
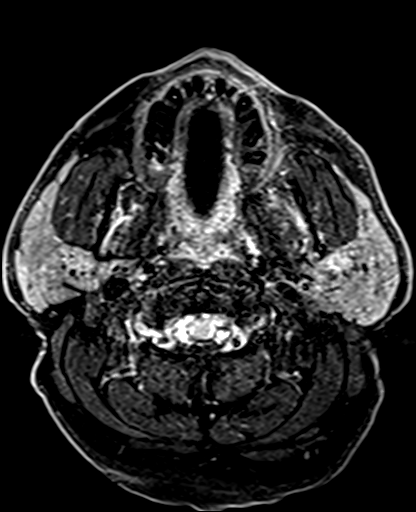
[im 18/52]
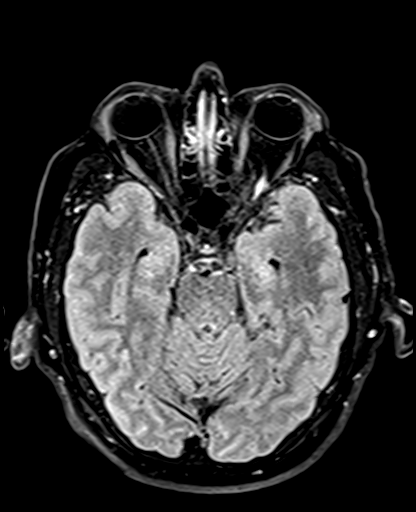
[im 35/52]
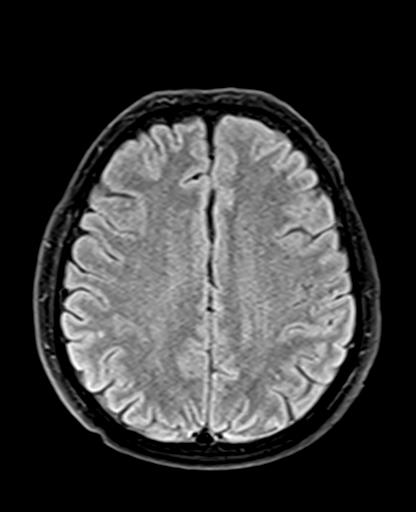
[im 52/52]
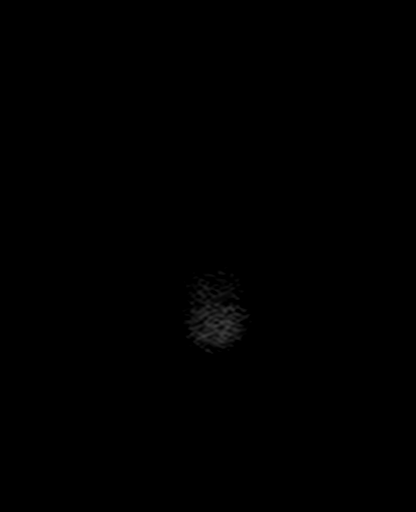

[Series 12: mag_images · axial · 3.0mm · 0.90mm/px · z∈[-6,+162]mm · 4 of 60 slices shown]
[im 1/60]
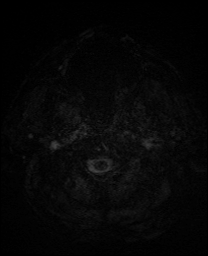
[im 20/60]
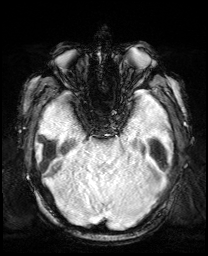
[im 40/60]
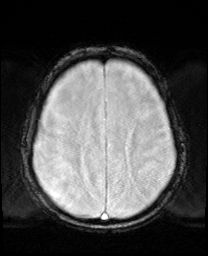
[im 60/60]
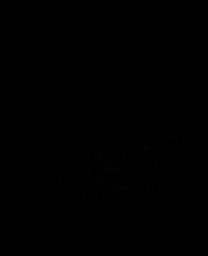

[Series 13: pha_images · axial · 3.0mm · 0.90mm/px · z∈[-6,+150]mm · 4 of 55 slices shown]
[im 1/55]
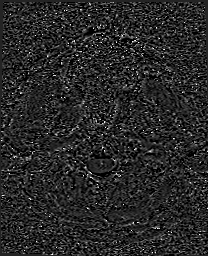
[im 19/55]
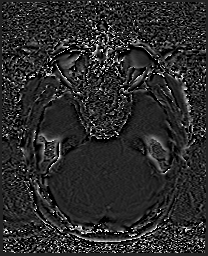
[im 37/55]
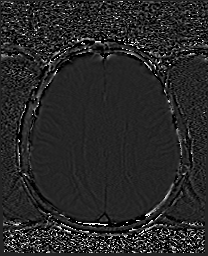
[im 55/55]
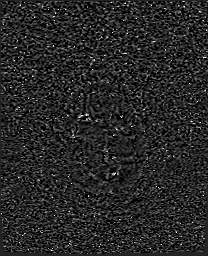

[Series 14: swi_images · axial · 3.0mm · 0.90mm/px · z∈[-6,+162]mm · 4 of 60 slices shown]
[im 1/60]
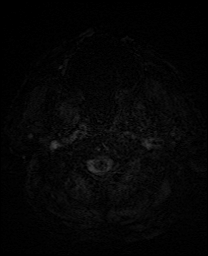
[im 20/60]
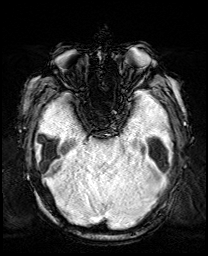
[im 40/60]
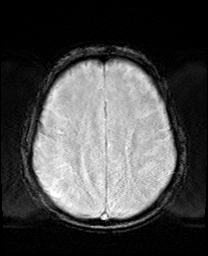
[im 60/60]
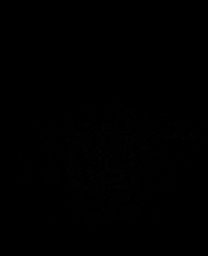

[Series 17: T2 · coronal · 4.0mm · 0.34mm/px · 2 of 34 slices shown (2 of 2)]
[im 1/34]
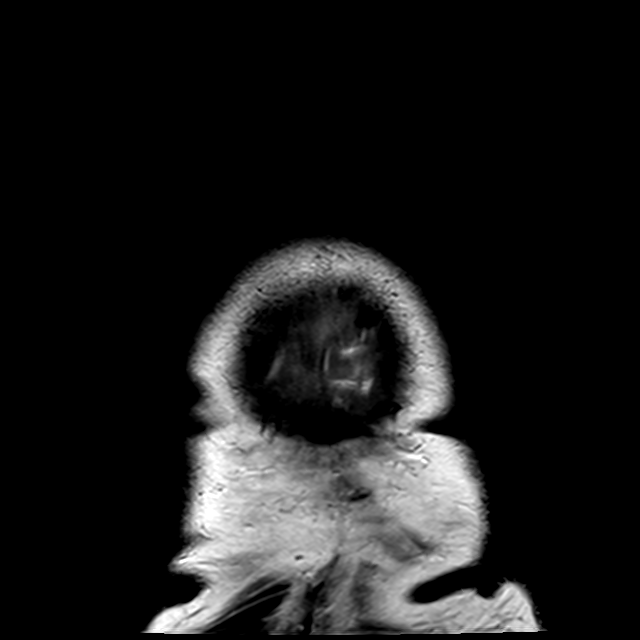
[im 34/34]
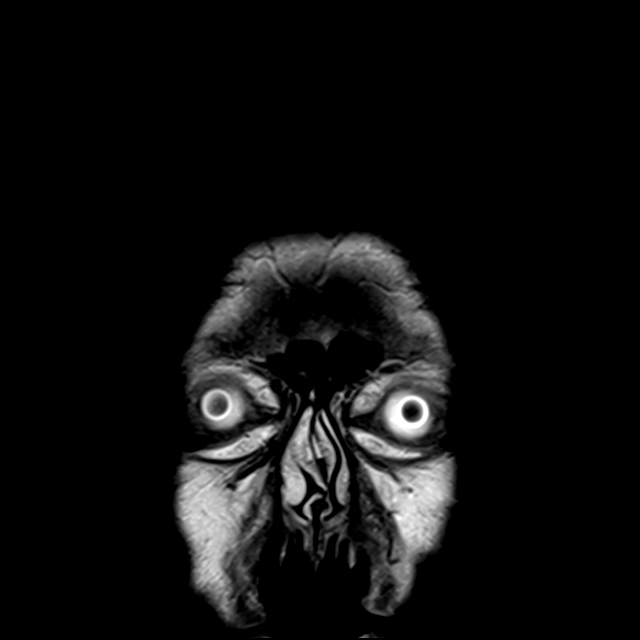

[44 of 48 positions shown; findings below may reference images not displayed]

FINDINGS: Study was performed under general anesthesia. Examination was
monitored. There was slight motion degradation which overall did not
significantly compromise the exam.

Brain: No evidence for acute infarction, hemorrhage, mass lesion,
hydrocephalus, or extra-axial fluid. Normal cerebral volume. Mild
subcortical greater than periventricular T2 and FLAIR
hyperintensities, likely chronic microvascular ischemic change.

Vascular: Normal flow voids.

Skull and upper cervical spine: Normal marrow signal.

Sinuses/Orbits: Negative.

Other: None.
IMPRESSION: Mild chronic microvascular ischemic change. But no acute
intracranial findings.

Good general agreement with prior CT.

No cause is seen for the reported symptoms.
